# Patient Record
Sex: Female | Born: 1965
Health system: Southern US, Community
[De-identification: ages and names within clinical notes are randomized; demographics above are authoritative.]

## PROBLEM LIST (undated history)

## (undated) ENCOUNTER — Ambulatory Visit (HOSPITAL_BASED_OUTPATIENT_CLINIC_OR_DEPARTMENT_OTHER): Payer: Self-pay | Source: Home / Self Care

## (undated) DIAGNOSIS — T7840XA Allergy, unspecified, initial encounter: Secondary | ICD-10-CM

## (undated) HISTORY — DX: Allergy, unspecified, initial encounter: T78.40XA

## (undated) HISTORY — PX: TOTAL VAGINAL HYSTERECTOMY: SHX2548

---

## 1998-04-09 ENCOUNTER — Other Ambulatory Visit: Admission: RE | Admit: 1998-04-09 | Discharge: 1998-04-09 | Payer: Self-pay | Admitting: Obstetrics and Gynecology

## 2002-07-04 ENCOUNTER — Encounter: Admission: RE | Admit: 2002-07-04 | Discharge: 2002-07-04 | Payer: Self-pay | Admitting: Neurosurgery

## 2002-07-04 ENCOUNTER — Encounter: Payer: Self-pay | Admitting: Neurosurgery

## 2002-07-18 ENCOUNTER — Encounter: Payer: Self-pay | Admitting: Neurosurgery

## 2002-07-18 ENCOUNTER — Encounter: Admission: RE | Admit: 2002-07-18 | Discharge: 2002-07-18 | Payer: Self-pay | Admitting: Neurosurgery

## 2002-08-08 ENCOUNTER — Encounter: Payer: Self-pay | Admitting: Neurosurgery

## 2002-08-08 ENCOUNTER — Encounter: Admission: RE | Admit: 2002-08-08 | Discharge: 2002-08-08 | Payer: Self-pay | Admitting: Neurosurgery

## 2004-01-11 ENCOUNTER — Ambulatory Visit (HOSPITAL_COMMUNITY): Admission: RE | Admit: 2004-01-11 | Discharge: 2004-01-11 | Payer: Self-pay | Admitting: Orthopaedic Surgery

## 2004-02-15 ENCOUNTER — Ambulatory Visit (HOSPITAL_COMMUNITY): Admission: RE | Admit: 2004-02-15 | Discharge: 2004-02-15 | Payer: Self-pay | Admitting: Orthopedic Surgery

## 2004-07-20 IMAGING — RF DG FLUORO GUIDE NDL PLC/BX
4 series · 4 of 4 positions shown · IV contrast (omniscan)
Comparison: none

CLINICAL DATA: Right hip pain for several months.  No known injury. 
 RIGHT HIP INJECTION FOR MRI
 Written informed consent was obtained.  I explained the procedure to the patient in detail.  An appropriate site for arthrocentesis was marked on the patient?s skin. The patient was prepped and draped in the usual sterile fashion.  Local anesthesia was achieved with 1% Xylocaine.  A 22 gauge spinal needle was then inserted down into the right hip joint and a total of 10 cc of a combination of Hypaque-JM, Omniscan, and 1% Xylocaine was injected.  Fluoroscopic spot image shows adequate distention of the joint with contrast. 
 IMPRESSION
 Right hip injection for MRI.

[Series 1: run · 1 of 1 slices shown (1 of 4)]
[im 1/1]
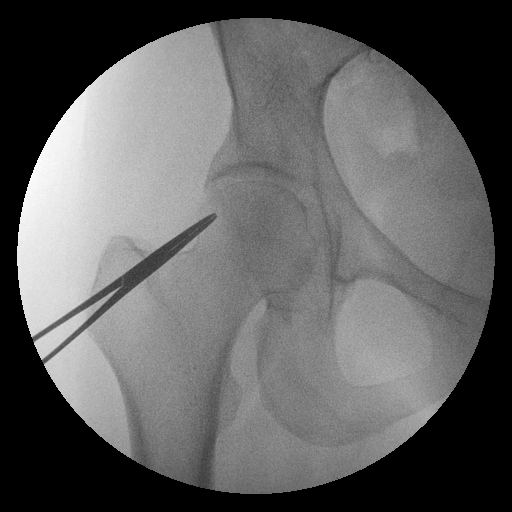

[Series 2: run · 1 of 1 slices shown (2 of 4)]
[im 1/1]
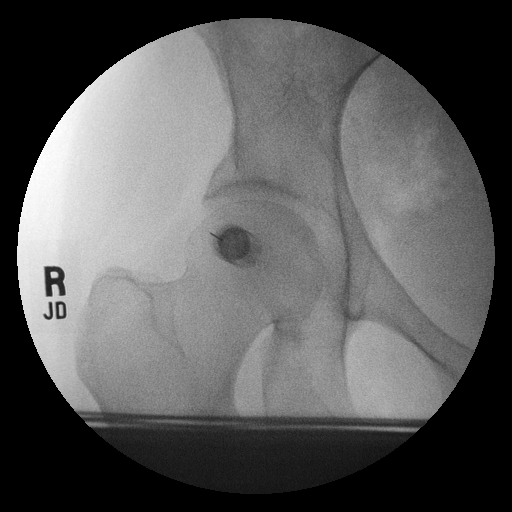

[Series 3: run · 1 of 1 slices shown (3 of 4)]
[im 1/1]
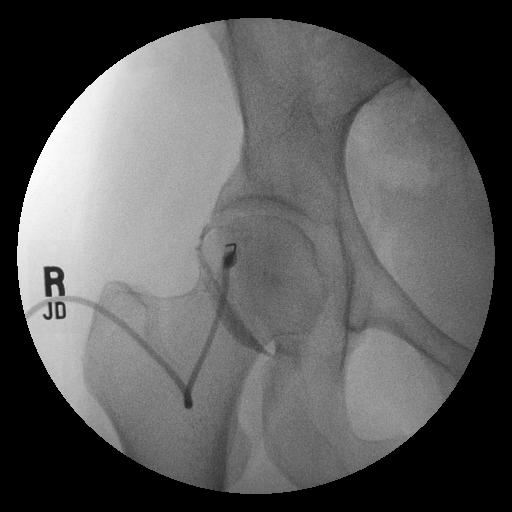

[Series 4: run · 1 of 1 slices shown (4 of 4)]
[im 1/1]
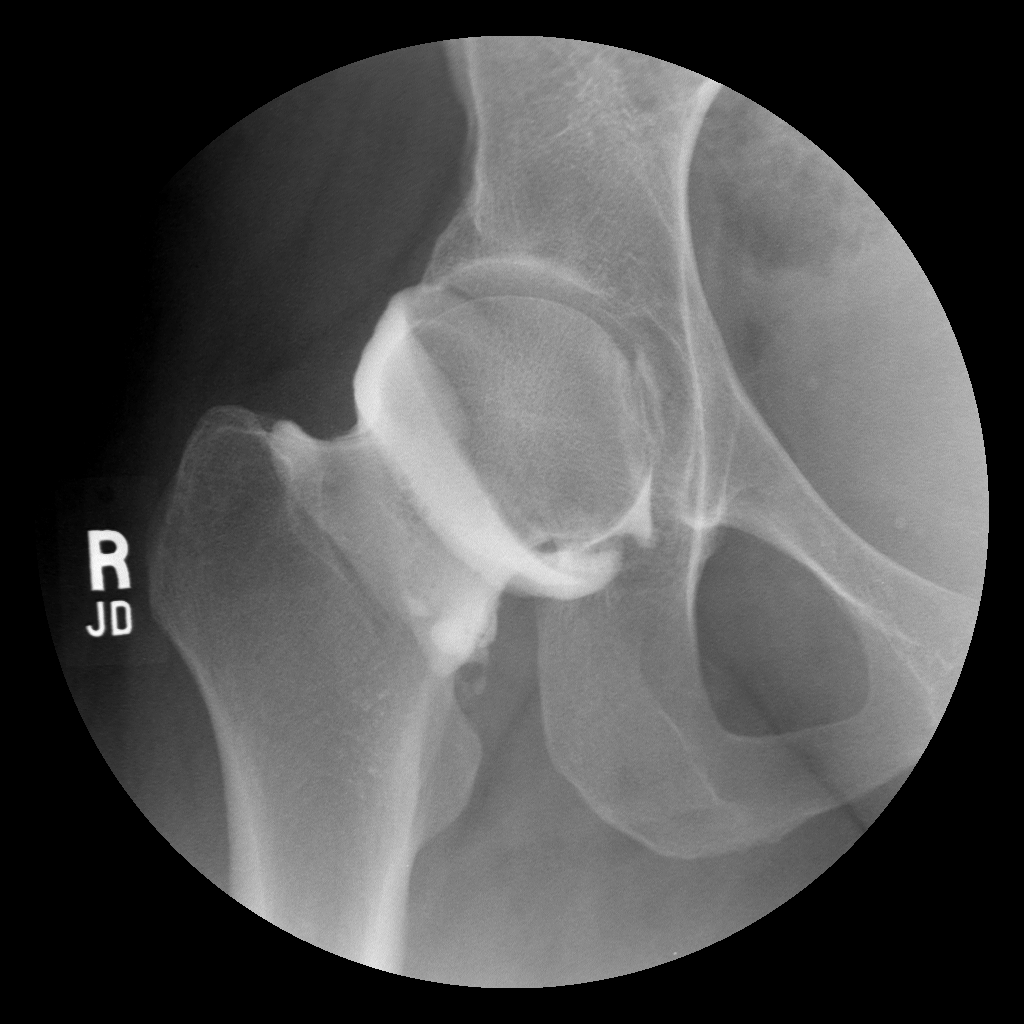

[4 of 4 positions shown; findings below may reference images not displayed]

## 2016-07-21 HISTORY — PX: COLONOSCOPY: SHX174

## 2017-10-21 DIAGNOSIS — D224 Melanocytic nevi of scalp and neck: Secondary | ICD-10-CM | POA: Diagnosis not present

## 2017-12-01 DIAGNOSIS — J329 Chronic sinusitis, unspecified: Secondary | ICD-10-CM | POA: Diagnosis not present

## 2019-08-29 ENCOUNTER — Encounter: Payer: Self-pay | Admitting: Gastroenterology

## 2023-03-31 ENCOUNTER — Ambulatory Visit (INDEPENDENT_AMBULATORY_CARE_PROVIDER_SITE_OTHER): Payer: BC Managed Care – PPO | Admitting: Allergy

## 2023-03-31 ENCOUNTER — Encounter: Payer: Self-pay | Admitting: Allergy

## 2023-03-31 VITALS — BP 108/66 | HR 60 | Temp 98.6°F | Resp 16 | Ht 64.5 in | Wt 144.0 lb

## 2023-03-31 DIAGNOSIS — T6391XD Toxic effect of contact with unspecified venomous animal, accidental (unintentional), subsequent encounter: Secondary | ICD-10-CM | POA: Diagnosis not present

## 2023-03-31 DIAGNOSIS — H1013 Acute atopic conjunctivitis, bilateral: Secondary | ICD-10-CM

## 2023-03-31 DIAGNOSIS — J310 Chronic rhinitis: Secondary | ICD-10-CM | POA: Diagnosis not present

## 2023-03-31 DIAGNOSIS — H109 Unspecified conjunctivitis: Secondary | ICD-10-CM

## 2023-03-31 MED ORDER — EPINEPHRINE 0.3 MG/0.3ML IJ SOAJ: 0.3000 mg | INTRAMUSCULAR | 2 refills | Status: AC | PRN

## 2023-03-31 NOTE — Progress Notes (Signed)
New Patient Note  RE: Tracey Abbott MRN: [id number] DOB: 12/31/1965 Date of Office Visit: 03/31/2023   Primary care provider: Street, Stephanie Coup, MD  Chief Complaint: wasp sting  History of present illness: Tracey Abbott is a 57 y.o. female presenting today for evaluation of allergic reaction to sting.   She states she was changing a bow on her wreath and there was a wasp nest behind the wreath.  She was stung once on her right middle finger.  Her right hand started to swell and went past her wrist.  She grabbed ice and took some benadryl. Her arm started to itch.  She state she started to feel weird. Maybe felt dizzy/lightheaded but states did not feel like she was going to pass out.  Her chest got tight and felt like it was hard to get a full breath.  She then noted wheeze.  She states her head hurt.  No nausea/vomiting.    She was treated with methylpred, pepcid and benadryl for a bee sting on 03/01/23 at [facility].   She states she had never had a reaction like this before.  She states [place] and a wasp nest was on the step railing and when she touched the railing they swarmed.   She has not been stung by a bee/wasp since.  She does not have an epipen at this time as states was denied as had not need allergist yet.    She does report seasonal allergies (sneezing, nasal congestion/drainage, when season changes and will take an OTC antihistamine and nasal spray.    Review of systems: 10 pt ROS systems negative unless noted above in HPI  Past medical history: History reviewed. No pertinent past medical history.  Past surgical history: Past Surgical History:  Procedure Laterality Date   TOTAL VAGINAL HYSTERECTOMY      Family history:  Family History  Problem Relation Age of Onset   Asthma Daughter    Allergic rhinitis Daughter    Food Allergy Daughter    Atopy Neg Hx    Eczema Neg Hx    Immunodeficiency  Neg Hx    Urticaria Neg Hx    Angioedema Neg Hx     Social history: Lives ina home without carpeting with electric heating and central cooling.  2 cats in the home.  No concern for water damage, mildew or roaches in the home.  She is a Runner, broadcasting/film/video.  Denies smoking history.    Medication List: Current Outpatient Medications  Medication Sig Dispense Refill   EPINEPHrine (EPIPEN 2-PAK) 0.3 mg/0.3 mL IJ SOAJ injection Inject 0.3 mg into the muscle as needed for anaphylaxis. 4 each 2   cetirizine (ZYRTEC) 10 MG tablet Take 10 mg by mouth daily. (Patient not taking: Reported on 03/31/2023)     famotidine (PEPCID) 40 MG tablet Take 40 mg by mouth daily. (Patient not taking: Reported on 03/31/2023)     No current facility-administered medications for this visit.    Known medication allergies: No Known Allergies   Physical examination: Blood pressure 108/66, pulse 60, temperature 98.6 F (37 C), temperature source Temporal, resp. rate 16, height 5' 4.5" (1.638 m), weight 144 lb (65.3 kg), SpO2 98%.  General: Alert, interactive, in no acute distress. HEENT: PERRLA, TMs pearly gray, turbinates non-edematous without discharge, post-pharynx non erythematous. Neck: Supple without lymphadenopathy. Lungs: Clear to auscultation without wheezing, rhonchi or rales. {no increased work  of breathing. CV: Normal S1, S2 without murmurs. Abdomen: Nondistended, nontender. Skin: Right middle finger medial aspect with small nodule with central indentation . Extremities:  No clubbing, cyanosis or edema. Neuro:   Grossly intact.  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   - with anaphylaxis to sting venom immunotherapy is recommended to decrease your risk of severe reaction upon a future sting.  It is a 5 year therapy that is a desensitization process so it decreases your allergy  to stinging insects and thus decreases risk of reaction.  It is a very effective therapy for venom allergy.   - will obtain stinging insect panel and tryptase level at this time.  If you have negative results from the stinging insect panel then will get you set up for skin testing for venoms.  - do your best to avoid future stings.  - have access to self-injectable epinephrine (Epipen or AuviQ) 0.3mg  at all times - follow emergency action plan in case of allergic reaction  Rhinoconjunctivitis - continue as needed use of long-acting antihistamine like Zyrtec, Allegra or Xyzal - for nasal congestion can use nasal steroid like Flonase, Rhinocort, Nasacort, Nasonex as needed - for nasal drainage can use nasal antihistamine Astepro as needed - for itchy/watery eyes can use eye drop Pataday as needed All of the above allergy medications are over-the-counter options  Follow-up in 1 year or sooner (pending lab results)  I appreciate the opportunity to take part in [facility] care. Please do not hesitate to contact me with questions.  Sincerely,   Margo Aye, MD Allergy/Immunology [facility]

## 2023-03-31 NOTE — Patient Instructions (Signed)
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   - with anaphylaxis to sting venom immunotherapy is recommended to decrease your risk of severe reaction upon a future sting.  It is a 5 year therapy that is a desensitization process so it decreases your allergy to stinging insects and thus decreases risk of reaction.  It is a very effective therapy for venom allergy.   - will obtain stinging insect panel and tryptase level at this time.  If you have negative results from the stinging insect panel then will get you set up for skin testing for venoms.  - do your best to avoid future stings.  - have access to self-injectable epinephrine (Epipen or AuviQ) 0.3mg  at all times - follow emergency action plan in case of allergic reaction  Environmental allergy - continue as needed use of long-acting antihistamine like Zyrtec, Allegra or Xyzal - for nasal congestion can use nasal steroid like Flonase, Rhinocort, Nasacort, Nasonex as needed - for nasal drainage can use nasal antihistamine Astepro as needed - for itchy/watery eyes can use eye drop Pataday as needed All of the above allergy medications are over-the-counter options  Follow-up in 1 year or sooner (pending lab results)

## 2024-04-25 ENCOUNTER — Ambulatory Visit (HOSPITAL_BASED_OUTPATIENT_CLINIC_OR_DEPARTMENT_OTHER)
Admission: EM | Admit: 2024-04-25 | Discharge: 2024-04-25 | Disposition: A | Discharge status: Home / Self Care | Attending: Family Medicine | Admitting: Family Medicine

## 2024-04-25 ENCOUNTER — Encounter (HOSPITAL_BASED_OUTPATIENT_CLINIC_OR_DEPARTMENT_OTHER): Payer: Self-pay | Admitting: Emergency Medicine

## 2024-04-25 DIAGNOSIS — S20469A Insect bite (nonvenomous) of unspecified back wall of thorax, initial encounter: Secondary | ICD-10-CM

## 2024-04-25 DIAGNOSIS — L282 Other prurigo: Secondary | ICD-10-CM

## 2024-04-25 DIAGNOSIS — W57XXXA Bitten or stung by nonvenomous insect and other nonvenomous arthropods, initial encounter: Secondary | ICD-10-CM

## 2024-04-25 MED ORDER — TRIAMCINOLONE ACETONIDE 40 MG/ML IJ SUSP
40.0000 mg | Freq: Once | INTRAMUSCULAR | Status: AC
  Administered 2024-04-25: 40 mg via INTRAMUSCULAR

## 2024-04-25 MED ORDER — CETIRIZINE HCL 10 MG PO TABS: 10.0000 mg | ORAL_TABLET | Freq: Every day | ORAL | 0 refills | Status: AC | PRN

## 2024-04-25 NOTE — Discharge Instructions (Addendum)
 Sand Mite Bites on arms, legs, chest, neck and back with itching rash: Kenalog  40 mg injection now (this is a steroid injection).  Use cetirizine , 10 mg, once or twice daily for itching and to help rash go away.  May use topical OTC hydrocortisone on the rash if needed.  Follow-up if symptoms do not improve, worsen or new symptoms occur.

## 2024-04-25 NOTE — ED Provider Notes (Signed)
 [facility]    CSN: [id number] Arrival date & time: 04/25/24  1841      History   Chief Complaint Chief Complaint  Patient presents with   Insect Bite    HPI Tracey Abbott is a 58 y.o. female.   58 year old female who had sand flea bites all over her upper back, neck, chest, arms, abdomen, lower back and legs.  This occurred on 04/23/2024.  It is left a bunch of bites or a red irritated rash that itches quite a bit.     History reviewed. No pertinent past medical history.  There are no active problems to display for this patient.   Past Surgical History:  Procedure Laterality Date   TOTAL VAGINAL HYSTERECTOMY      OB History   No obstetric history on file.      Home Medications    Prior to Admission medications   Medication Sig Start Date End Date Taking? Authorizing Provider  EPINEPHrine  (EPIPEN  2-PAK) 0.3 mg/0.3 mL IJ SOAJ injection Inject 0.3 mg into the muscle as needed for anaphylaxis. 03/31/23  Yes Padgett, Danita Macintosh, MD  cetirizine  (ZYRTEC ) 10 MG tablet Take 1 tablet (10 mg total) by mouth daily as needed for allergies (itching). 04/25/24   Ival Domino, FNP    Family History Family History  Problem Relation Age of Onset   Asthma Daughter    Allergic rhinitis Daughter    Food Allergy Daughter    Atopy Neg Hx    Eczema Neg Hx    Immunodeficiency Neg Hx    Urticaria Neg Hx    Angioedema Neg Hx     Social History Social History   Tobacco Use   Smoking status: Never    Passive exposure: Never   Smokeless tobacco: Never  Vaping Use   Vaping status: Never Used  Substance Use Topics   Alcohol use: Never   Drug use: Never     Allergies   Bee venom   Review of Systems Review of Systems  Constitutional:  Negative for fever.  Respiratory:  Negative for cough.   Cardiovascular:  Negative for chest pain.  Gastrointestinal:  Negative for abdominal pain, constipation, diarrhea, nausea and vomiting.  Musculoskeletal:   Negative for arthralgias and back pain.  Skin:  Positive for rash (From bug bites on arms, chest, upper back and legs). Negative for color change.  Neurological:  Negative for syncope.  All other systems reviewed and are negative.    Physical Exam Triage Vital Signs ED Triage Vitals  Encounter Vitals Group     BP 04/25/24 1922 130/85     Girls Systolic BP Percentile --      Girls Diastolic BP Percentile --      Boys Systolic BP Percentile --      Boys Diastolic BP Percentile --      Pulse Rate 04/25/24 1922 (!) 55     Resp 04/25/24 1922 18     Temp 04/25/24 1922 97.8 F (36.6 C)     Temp Source 04/25/24 1922 Oral     SpO2 04/25/24 1922 97 %     Weight --      Height --      Head Circumference --      Peak Flow --      Pain Score 04/25/24 1921 0     Pain Loc --      Pain Education --      Exclude from Growth Chart --  No data found.  Updated Vital Signs BP 130/85 (BP Location: Right Arm)   Pulse (!) 55   Temp 97.8 F (36.6 C) (Oral)   Resp 18   SpO2 97%   Visual Acuity Right Eye Distance:   Left Eye Distance:   Bilateral Distance:    Right Eye Near:   Left Eye Near:    Bilateral Near:     Physical Exam Vitals and nursing note reviewed.  Constitutional:      General: She is not in acute distress.    Appearance: She is well-developed. She is not ill-appearing or toxic-appearing.  HENT:     Head: Normocephalic and atraumatic.     Right Ear: External ear normal.     Left Ear: External ear normal.     Nose: Nose normal.     Mouth/Throat:     Lips: Pink.     Mouth: Mucous membranes are moist.  Eyes:     Conjunctiva/sclera: Conjunctivae normal.     Pupils: Pupils are equal, round, and reactive to light.  Cardiovascular:     Rate and Rhythm: Normal rate and regular rhythm.     Heart sounds: S1 normal and S2 normal. No murmur heard. Pulmonary:     Effort: Pulmonary effort is normal. No respiratory distress.     Breath sounds: Normal breath sounds. No  decreased breath sounds, wheezing, rhonchi or rales.  Musculoskeletal:        General: No swelling.  Skin:    General: Skin is warm and dry.     Capillary Refill: Capillary refill takes less than 2 seconds.     Findings: Rash (See comments for more information) present.     Comments: Tiny bites or puncture wounds all over her arms, upper back, lower back, abdomen, chest, lower neck and legs.  These are small red spots with central puncture wounds.  There are scratch marks from the patient due to itching.  There is no sign of induration nor exudate.  See photo for more information.  Neurological:     Mental Status: She is alert and oriented to person, place, and time.  Psychiatric:        Mood and Affect: Mood normal.      UC Treatments / Results  Labs (all labs ordered are listed, but only abnormal results are displayed) Labs Reviewed - No data to display  EKG   Radiology No results found.  Procedures Procedures (including critical care time)  Medications Ordered in UC Medications  triamcinolone  acetonide (KENALOG -40) injection 40 mg (40 mg Intramuscular Given 04/25/24 1953)    Initial Impression / Assessment and Plan / UC Course  I have reviewed the triage vital signs and the nursing notes.  Pertinent labs & imaging results that were available during my care of the patient were reviewed by me and considered in my medical decision making (see chart for details).  Plan of Care: Insect bites and secondary pruritic rash: Kenalog  40 mg now.  Use cetirizine  10 mg once or twice daily to help the rash go away.  See discharge instructions for more information.  Follow-up if symptoms do not improve, worsen or new symptoms occur.  I reviewed the plan of care with the patient and/or the patient's guardian.  The patient and/or guardian had time to ask questions and acknowledged that the questions were answered.  I provided instruction on symptoms or reasons to return here or to go to  an ER, if symptoms/condition did not improve, worsened  or if new symptoms occurred.  Final Clinical Impressions(s) / UC Diagnoses   Final diagnoses:  Pruritic rash  Insect bite of back wall of thorax, unspecified location, initial encounter     Discharge Instructions      Sand Mite Bites on arms, legs, chest, neck and back with itching rash: Kenalog  40 mg injection now (this is a steroid injection).  Use cetirizine , 10 mg, once or twice daily for itching and to help rash go away.  May use topical OTC hydrocortisone on the rash if needed.  Follow-up if symptoms do not improve, worsen or new symptoms occur.     ED Prescriptions     Medication Sig Dispense Auth. Provider   cetirizine  (ZYRTEC ) 10 MG tablet Take 1 tablet (10 mg total) by mouth daily as needed for allergies (itching). 30 tablet Cystal Shannahan, FNP      PDMP not reviewed this encounter.   Ival Domino, FNP 04/25/24 2017

## 2024-04-25 NOTE — ED Triage Notes (Signed)
 Pt thinks its from sand fleas she has multiple bites all over body mainly on her chest area, arms, back of neck and back of legs [place] the beach [place].

## 2024-06-16 ENCOUNTER — Encounter: Payer: Self-pay | Admitting: Gastroenterology

## 2024-07-07 ENCOUNTER — Ambulatory Visit

## 2024-07-07 VITALS — Ht 64.5 in | Wt 145.0 lb

## 2024-07-07 DIAGNOSIS — Z1211 Encounter for screening for malignant neoplasm of colon: Secondary | ICD-10-CM

## 2024-07-07 MED ORDER — NA SULFATE-K SULFATE-MG SULF 17.5-3.13-1.6 GM/177ML PO SOLN: 1.0000 | Freq: Once | ORAL | 0 refills | Status: AC

## 2024-07-07 NOTE — Progress Notes (Signed)

## 2024-07-20 ENCOUNTER — Encounter: Payer: Self-pay | Admitting: Gastroenterology

## 2024-07-20 ENCOUNTER — Ambulatory Visit (AMBULATORY_SURGERY_CENTER): Admitting: Gastroenterology

## 2024-07-20 VITALS — BP 127/86 | HR 64 | Temp 98.0°F | Resp 13 | Ht 64.5 in | Wt 145.0 lb

## 2024-07-20 DIAGNOSIS — K635 Polyp of colon: Secondary | ICD-10-CM

## 2024-07-20 DIAGNOSIS — K5730 Diverticulosis of large intestine without perforation or abscess without bleeding: Secondary | ICD-10-CM

## 2024-07-20 DIAGNOSIS — Z1211 Encounter for screening for malignant neoplasm of colon: Secondary | ICD-10-CM | POA: Diagnosis present

## 2024-07-20 DIAGNOSIS — D123 Benign neoplasm of transverse colon: Secondary | ICD-10-CM

## 2024-07-20 DIAGNOSIS — K640 First degree hemorrhoids: Secondary | ICD-10-CM

## 2024-07-20 DIAGNOSIS — Z860101 Personal history of adenomatous and serrated colon polyps: Secondary | ICD-10-CM | POA: Diagnosis not present

## 2024-07-20 DIAGNOSIS — D122 Benign neoplasm of ascending colon: Secondary | ICD-10-CM

## 2024-07-20 MED ORDER — SODIUM CHLORIDE 0.9 % IV SOLN: 500.0000 mL | Freq: Once | INTRAVENOUS | Status: DC

## 2024-07-20 NOTE — Progress Notes (Signed)
 Called to room to assist during endoscopic procedure.  Patient ID and intended procedure confirmed with present staff. Received instructions for my participation in the procedure from the performing physician.

## 2024-07-20 NOTE — Patient Instructions (Signed)
 YOU HAD AN ENDOSCOPIC PROCEDURE TODAY AT THE [facility]:   Refer to the procedure report that was given to you for any specific questions about what was found during the examination.  If the procedure report does not answer your questions, please call your gastroenterologist to clarify.  If you requested that your care partner not be given the details of your procedure findings, then the procedure report has been included in a sealed envelope for you to review at your convenience later.  YOU SHOULD EXPECT: Some feelings of bloating in the abdomen. Passage of more gas than usual.  Walking can help get rid of the air that was put into your GI tract during the procedure and reduce the bloating. If you had a lower endoscopy (such as a colonoscopy or flexible sigmoidoscopy) you may notice spotting of blood in your stool or on the toilet paper. If you underwent a bowel prep for your procedure, you may not have a normal bowel movement for a few days.  Please Note:  You might notice some irritation and congestion in your nose or some drainage.  This is from the oxygen used during your procedure.  There is no need for concern and it should clear up in a day or so.  SYMPTOMS TO REPORT IMMEDIATELY:  Following lower endoscopy (colonoscopy or flexible sigmoidoscopy):  Excessive amounts of blood in the stool  Significant tenderness or worsening of abdominal pains  Swelling of the abdomen that is new, acute  Fever of 100F or higher  Resume previous diet Continue present medications Await pathology results Handouts on diverticulosis, hemorrhoids and polyps given   For urgent or emergent issues, a gastroenterologist can be reached at any hour by calling [phone]. Do not use MyChart messaging for urgent concerns.    DIET:  We do recommend a small meal at first, but then you may proceed to your regular diet.  Drink plenty of fluids but you should avoid alcoholic beverages for 24  hours.  ACTIVITY:  You should plan to take it easy for the rest of today and you should NOT DRIVE or use heavy machinery until tomorrow (because of the sedation medicines used during the test).    FOLLOW UP: Our staff will call the number listed on your records the next business day following your procedure.  We will call around 7:15- 8:00 am to check on you and address any questions or concerns that you may have regarding the information given to you following your procedure. If we do not reach you, we will leave a message.     If any biopsies were taken you will be contacted by phone or by letter within the next 1-3 weeks.  Please call us  at [phone] if you have not heard about the biopsies in 3 weeks.    SIGNATURES/CONFIDENTIALITY: You and/or your care partner have signed paperwork which will be entered into your electronic medical record.  These signatures attest to the fact that that the information above on your After Visit Summary has been reviewed and is understood.  Full responsibility of the confidentiality of this discharge information lies with you and/or your care-partner.

## 2024-07-20 NOTE — Progress Notes (Signed)
 [facility] History and Physical   Primary Care Physician:  Street, Lonni HERO, MD   Reason for Procedure:   H/O polyps  Plan:    colon   The patient was provided an opportunity to ask questions and all were answered. The patient agreed with the plan.   HPI: Tracey Abbott is a 58 y.o. female    Past Medical History:  Diagnosis Date   Allergy     Past Surgical History:  Procedure Laterality Date   TOTAL VAGINAL HYSTERECTOMY      Prior to Admission medications   Medication Sig Start Date End Date Taking? Authorizing Provider  acyclovir ointment (ZOVIRAX) 5 % 1 APPLICATION AS NEEDED FOR FEVER BLISTERS    [provider]  cetirizine  (ZYRTEC ) 10 MG tablet Take 1 tablet (10 mg total) by mouth daily as needed for allergies (itching). 04/25/24   Ival Domino, FNP  EPINEPHrine  (EPIPEN  2-PAK) 0.3 mg/0.3 mL IJ SOAJ injection Inject 0.3 mg into the muscle as needed for anaphylaxis. 03/31/23   Jeneal Danita Macintosh, MD    Current Outpatient Medications  Medication Sig Dispense Refill   acyclovir ointment (ZOVIRAX) 5 % 1 APPLICATION AS NEEDED FOR FEVER BLISTERS     cetirizine  (ZYRTEC ) 10 MG tablet Take 1 tablet (10 mg total) by mouth daily as needed for allergies (itching). 30 tablet 0   EPINEPHrine  (EPIPEN  2-PAK) 0.3 mg/0.3 mL IJ SOAJ injection Inject 0.3 mg into the muscle as needed for anaphylaxis. 4 each 2   Current Facility-Administered Medications  Medication Dose Route Frequency Provider Last Rate Last Admin   0.9 %  sodium chloride infusion  500 mL Intravenous Once Charlanne Groom, MD        Allergies as of 07/20/2024 - Review Complete 07/20/2024  Allergen Reaction Noted   Bee venom Shortness Of Breath 04/25/2024    Family History  Problem Relation Age of Onset   Colon polyps Father    Asthma Daughter    Allergic rhinitis Daughter    Food Allergy Daughter    Atopy Neg Hx    Eczema Neg Hx    Immunodeficiency Neg Hx    Urticaria Neg Hx     Angioedema Neg Hx    Colon cancer Neg Hx    Esophageal cancer Neg Hx    Rectal cancer Neg Hx    Stomach cancer Neg Hx     Social History   Socioeconomic History   Marital status: Married    Spouse name: Not on file   Number of children: Not on file   Years of education: Not on file   Highest education level: Not on file  Occupational History   Not on file  Tobacco Use   Smoking status: Never    Passive exposure: Never   Smokeless tobacco: Never  Vaping Use   Vaping status: Never Used  Substance and Sexual Activity   Alcohol use: Never   Drug use: Never   Sexual activity: Not on file  Other Topics Concern   Not on file  Social History Narrative   ** Merged History Encounter **       Social Drivers of Corporate Investment Banker Strain: Not on file  Food Insecurity: Not on file  Transportation Needs: Not on file  Physical Activity: Not on file  Stress: Not on file  Social Connections: Not on file  Intimate Partner Violence: Not on file    Review of Systems: Positive for none All other review of systems  negative except as mentioned in the HPI.  Physical Exam: Vital signs in last 24 hours: @VSRANGES @   General:   Alert,  Well-developed, well-nourished, pleasant and cooperative in NAD Lungs:  Clear throughout to auscultation.   Heart:  Regular rate and rhythm; no murmurs, clicks, rubs,  or gallops. Abdomen:  Soft, nontender and nondistended. Normal bowel sounds.   Neuro/Psych:  Alert and cooperative. Normal mood and affect. A and O x 3    No significant changes were identified.  The patient continues to be an appropriate candidate for the planned procedure and anesthesia.   Anselm Bring, MD. [facility] 07/20/2024 1:17 PM@

## 2024-07-20 NOTE — Progress Notes (Signed)
 Report given to PACU, vss

## 2024-07-20 NOTE — Op Note (Signed)
  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 The patient's tolerance of                            previous anesthesia was also reviewed. The risks                            and benefits of the procedure and the sedation                            options and risks were discussed with the patient.                            All questions were answered, and informed consent                            was obtained. Prior Anticoagulants: The patient has                            taken no anticoagulant or antiplatelet agents. ASA                            Grade Assessment: I - A normal, healthy patient.                            After reviewing the risks and benefits, the patient                            was deemed in satisfactory condition to undergo the  procedure.                           After obtaining informed consent, the colonoscope                            was passed under direct vision. Throughout the                            procedure, the patient's blood pressure, pulse, and                            oxygen saturations were monitored continuously. The                            Olympus Scope PCF [id number] was introduced through                             the anus and advanced to the 2 cm into the ileum.                            The colonoscopy was performed without difficulty.                            The patient tolerated the procedure well. The                            quality of the bowel preparation was good. The                            terminal ileum, ileocecal valve, appendiceal                            orifice, and rectum were photographed. Scope In: 1:20:20 PM Scope Out: 1:36:53 PM Scope Withdrawal Time: 0 hours 11 minutes 46 seconds  Total Procedure Duration: 0 hours 16 minutes 33 seconds  Findings:                 Three sessile polyps were found in the mid                            transverse colon, distal transverse colon and mid                            ascending colon. The polyps were 5 to 6 mm in size.                            These polyps were removed with a cold snare.                            Resection and retrieval were complete.                           Rare (1-2) small-mouthed diverticula were found in  the sigmoid colon.                           Non-bleeding internal hemorrhoids were found during                            retroflexion. The hemorrhoids were small and Grade                            I (internal hemorrhoids that do not prolapse).                           The terminal ileum appeared normal.                           Retroflexion in the right colon was performed.                           The exam was otherwise without abnormality on                            direct and retroflexion views. Complications:            No immediate complications. Estimated Blood Loss:     Estimated blood loss: none. Impression:               - Three 5 to 6 mm polyps in the mid transverse                            colon, in the distal transverse colon and in the                            mid ascending colon, removed with a cold snare.                             Resected and retrieved.                           - Very minimal sigmoid diverticulosis.                           - Non-bleeding internal hemorrhoids.                           - The examined portion of the ileum was normal.                           - The examination was otherwise normal on direct                            and retroflexion views. Recommendation:           - Patient has a contact number available for                            emergencies. The signs and symptoms of potential  delayed complications were discussed with the                            patient. Return to normal activities tomorrow.                            Written discharge instructions were provided to the                            patient.                           - Resume previous diet.                           - Continue present medications.                           - Await pathology results.                           - Repeat colonoscopy for surveillance based on                            pathology results.                           - The findings and recommendations were discussed                            with the patient's family. Lynnie Bring, MD 07/20/2024 1:41:52 PM This report has been signed electronically.

## 2024-07-21 ENCOUNTER — Telehealth: Payer: Self-pay

## 2024-07-21 NOTE — Telephone Encounter (Signed)
  Follow up Call-     07/20/2024   12:42 PM  Call back number  Post procedure Call Back phone  # [phone]  Permission to leave phone message Yes     Patient questions:  Do you have a fever, pain , or abdominal swelling? No. Pain Score  0 *  Have you tolerated food without any problems? Yes.    Have you been able to return to your normal activities? Yes.    Do you have any questions about your discharge instructions: Diet   No. Medications  No. Follow up visit  No.  Do you have questions or concerns about your Care? No.  Actions: * If pain score is 4 or above: No action needed, pain <4.

## 2024-07-25 LAB — SURGICAL PATHOLOGY

## 2024-07-30 ENCOUNTER — Ambulatory Visit: Payer: Self-pay | Admitting: Gastroenterology
